# Patient Record
Sex: Female | Born: 1950 | Race: White | Hispanic: No | Marital: Single
Health system: Southern US, Community
[De-identification: ages and names within clinical notes are randomized; demographics above are authoritative.]

---

## 2012-01-24 LAB — CBC
MCH: 31.3 pg (ref 26.0–34.0)
MCHC: 35.2 g/dL (ref 32.0–36.0)
MCV: 89 fL (ref 80–100)
Platelet: 202 10*3/uL (ref 150–440)
RBC: 4.41 10*6/uL (ref 3.80–5.20)
RDW: 14.4 % (ref 11.5–14.5)

## 2012-01-25 ENCOUNTER — Observation Stay: Payer: Self-pay | Admitting: Specialist

## 2012-01-25 LAB — DRUG SCREEN, URINE
Cocaine Metabolite,Ur ~~LOC~~: NEGATIVE (ref ?–300)
MDMA (Ecstasy)Ur Screen: NEGATIVE (ref ?–500)
Tricyclic, Ur Screen: NEGATIVE (ref ?–1000)

## 2012-01-25 LAB — COMPREHENSIVE METABOLIC PANEL
Albumin: 3.8 g/dL (ref 3.4–5.0)
Anion Gap: 6 — ABNORMAL LOW (ref 7–16)
Calcium, Total: 9.3 mg/dL (ref 8.5–10.1)
Chloride: 106 mmol/L (ref 98–107)
Co2: 24 mmol/L (ref 21–32)
Creatinine: 0.7 mg/dL (ref 0.60–1.30)
EGFR (African American): >60
EGFR (Non-African Amer.): >60
Glucose: 101 mg/dL — ABNORMAL HIGH (ref 65–99)
Potassium: 4.1 mmol/L (ref 3.5–5.1)
SGOT(AST): 21 U/L (ref 15–37)

## 2012-01-25 LAB — URINALYSIS, COMPLETE
Leukocyte Esterase: NEGATIVE
Protein: NEGATIVE
RBC,UR: <1 /HPF (ref 0–5)
Specific Gravity: 1.017 (ref 1.003–1.030)
Squamous Epithelial: NONE SEEN

## 2012-01-25 LAB — PROTIME-INR
INR: 1
Prothrombin Time: 13.5 secs (ref 11.5–14.7)

## 2012-01-25 LAB — CK TOTAL AND CKMB (NOT AT ARMC)
CK, Total: 61 U/L (ref 21–215)
CK, Total: 39 U/L (ref 21–215)

## 2012-01-25 LAB — APTT: Activated PTT: 40.7 secs — ABNORMAL HIGH (ref 23.6–35.9)

## 2012-01-26 LAB — BASIC METABOLIC PANEL
BUN: 15 mg/dL (ref 7–18)
Chloride: 107 mmol/L (ref 98–107)
Creatinine: 0.86 mg/dL (ref 0.60–1.30)
Osmolality: 279 (ref 275–301)
Potassium: 4.4 mmol/L (ref 3.5–5.1)

## 2012-01-26 LAB — CBC WITH DIFFERENTIAL/PLATELET
Basophil #: 0 10*3/uL (ref 0.0–0.1)
Basophil %: 0.5 %
Eosinophil #: 0.2 10*3/uL (ref 0.0–0.7)
HGB: 13.8 g/dL (ref 12.0–16.0)
MCH: 30.7 pg (ref 26.0–34.0)
MCHC: 34.2 g/dL (ref 32.0–36.0)
Monocyte #: 0.4 x10 3/mm (ref 0.2–0.9)
Neutrophil %: 67.1 %
Platelet: 173 10*3/uL (ref 150–440)
RDW: 14.7 % — ABNORMAL HIGH (ref 11.5–14.5)

## 2012-01-26 LAB — LIPID PANEL
HDL Cholesterol: 35 mg/dL — ABNORMAL LOW (ref 40–60)
Triglycerides: 264 mg/dL — ABNORMAL HIGH (ref 0–200)
VLDL Cholesterol, Calc: 53 mg/dL — ABNORMAL HIGH (ref 5–40)

## 2012-06-01 ENCOUNTER — Inpatient Hospital Stay: Payer: Self-pay | Admitting: Internal Medicine

## 2012-06-01 LAB — DRUG SCREEN, URINE
Barbiturates, Ur Screen: NEGATIVE (ref ?–200)
Benzodiazepine, Ur Scrn: NEGATIVE (ref ?–200)
Cannabinoid 50 Ng, Ur ~~LOC~~: NEGATIVE (ref ?–50)
Cocaine Metabolite,Ur ~~LOC~~: NEGATIVE (ref ?–300)
Methadone, Ur Screen: NEGATIVE (ref ?–300)
Opiate, Ur Screen: NEGATIVE (ref ?–300)
Phencyclidine (PCP) Ur S: NEGATIVE (ref ?–25)

## 2012-06-01 LAB — URINALYSIS, COMPLETE
Bacteria: NONE SEEN
Ketone: NEGATIVE
Leukocyte Esterase: NEGATIVE
RBC,UR: <1 /HPF (ref 0–5)
WBC UR: 1 /HPF (ref 0–5)

## 2012-06-01 LAB — COMPREHENSIVE METABOLIC PANEL
Anion Gap: 6 — ABNORMAL LOW (ref 7–16)
Bilirubin,Total: 0.2 mg/dL (ref 0.2–1.0)
Calcium, Total: 8.8 mg/dL (ref 8.5–10.1)
Co2: 26 mmol/L (ref 21–32)
EGFR (African American): >60
Osmolality: 275 (ref 275–301)
SGOT(AST): 26 U/L (ref 15–37)
Total Protein: 6.7 g/dL (ref 6.4–8.2)

## 2012-06-01 LAB — CBC
HGB: 13.5 g/dL (ref 12.0–16.0)
MCH: 29.3 pg (ref 26.0–34.0)
MCV: 88 fL (ref 80–100)
RDW: 14 % (ref 11.5–14.5)

## 2014-06-05 NOTE — H&P (Signed)
PATIENT NAME:  Natalie Clements, Natalie Clements MR#:  [id number] DATE OF BIRTH:  [birth year]  DATE OF ADMISSION:  01/25/2012  PRIMARY CARE PHYSICIAN: Unknown  REFERRING PHYSICIAN: Dr. Cyril LoosenKinner   CHIEF COMPLAINT: Altered mental status, dysarthria.   HISTORY OF PRESENT ILLNESS: Patient is a 64 year old Caucasian female with a past medical history of asthma, recent history of shingles and nicotine dependence was sent over to the ER via EMS by patient's baby sitter's parents. Around 10:00 p.m. today patient was baby sitting and suddenly felt off balance. She sat down and then started mumbling. Baby sitter's parents called EMS and sent her to the ER. By the time patient came into the ER she was nonresponsive to her name. By shaking shoulders patient woke up but speech was not clear and she was mumbling as reported by the ER physician. She has normal motor strength. Initially CAT scan of the head was done which was negative for any acute findings. Her urine drug screen was positive for amphetamines. Patient was evaluated by tele neurologist. Patient was lethargic and drowsy but her NIHSS score was just 1 for drowsiness. Tele neurologist has not recommended TPA. Hospitalist team is called to admit the patient under observation status for close monitoring and for further evaluation. During my examination patient is quite lethargic but arousable to verbal commands. Patient was still lethargic and confused and trying to take her gown off. No family members are at bedside. She was able to tell her name but disoriented to place and person. Patient was still mumbling but was answering most of the questions. She has reported that she does not know why she is in the ER. She could not recall any of the events happened during last night. Patient denied any headache, blurry vision, chest pain or shortness of breath. She has reported that recently she had shingles and she was started on gabapentin for shingles. She takes phenteramine for losing  weight. 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 Patient was slowly responding to verbal commands but following commands.   PAST MEDICAL HISTORY:  1. Asthma. 2. Recent history of shingles.  3. Nicotine dependence.  4. Denies any heart attacks or diabetes mellitus.   PAST SURGICAL HISTORY: Denies any.   ALLERGIES: Reports no known drug allergies.   HOME MEDICATIONS:  1. Ventolin 2 puffs inhalation q.6 hours. 2. Phentermine 30 mg once daily. 3. Gabapentin 300 mg 1 capsule 3 times a day.   PSYCHOSOCIAL HISTORY: Lives at home, lives with son. Smokes 1 pack a day. Denies alcohol or illicit drug usage.   FAMILY HISTORY: Father had CAD with stent placement.    REVIEW OF SYSTEMS: CONSTITUTIONAL: Denies any fever, fatigue, weakness, weight loss or weight gain. EYES: Right eye is blind. Denies any blurry vision in the left eye. Denies any cataracts. ENT: Denies tinnitus, ear pain, discharge, snoring, postnasal drip, difficulty in swallowing. RESPIRATORY: Denies cough, wheezing, hemoptysis, painful respirations. CARDIOVASCULAR: Denies any chest pain, orthopnea, syncope, varicose veins. GASTROINTESTINAL: Denies nausea, vomiting, diarrhea, abdominal pain, constipation, rectal bleeding, GERD, change in bowel habits. GENITOURINARY: Denies dysuria, hematuria, renal calculus. GYN: Denies any breast mass, tenderness. Denies any discharge. ENDOCRINE: Denies polyuria, polyphagia, polydipsia, nocturia, thyroid problems, heat or cold intolerance. HEMATOLOGIC/LYMPHATIC: Denies anemia, easy bruising, bleeding. INTEGUMENTARY: Denies acne, rash, lesions. MUSCULOSKELETAL: Denies any pain in the neck, back, shoulders. Denies any arthritis. Denies any gout. NEUROLOGIC: Denies any weakness. Denies any epilepsy. Denies vertigo, ataxia. PSYCH:  Denies anxiety, insomnia, depression, schizophrenia.   PHYSICAL EXAMINATION:  VITAL SIGNS: Temperature 97.8, pulse 66,  respiratory rate 18, blood pressure 114/58.   GENERAL APPEARANCE: Moderately built and moderately nourished. Not in acute distress. Lethargic but arousable and answering most of the questions appropriately.   HEENT: Normocephalic, atraumatic. 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 Right corneal scarring is present. Left eye pupil is dilated to 6 mm, actively reacting to light and accommodation. No conjunctival injection. No scleral icterus. Extraocular movements are intact.  No discharge from the ear. External ear is intact. Tympanic membranes are intact. Nares are patent, no discharge. No postnasal drip. Throat is clear. No laryngeal edema.   NECK: Supple. No JVD. No carotid bruits. No thyromegaly.   LUNGS: Clear to auscultation bilaterally. Moderate air entry. No wheezing. No accessory muscle usage. No chest wall tenderness.   CARDIOVASCULAR: S1, S2 normal. Regular rate and rhythm. No pedal edema. Peripheral pulses are 2+. No murmurs.   GASTROINTESTINAL: Soft. Bowel sounds are positive in all four quadrants. No masses felt. Nontender, nondistended.   NEUROLOGIC: Lethargic but arousable, oriented x1, following verbal commands but sluggishly. Cranial nerves II through XII are grossly intact. Motor intact. No pronator drift. Sensory is normal, intact for touch sensation. Reflexes 2+. Cerebellar gait is unsteady. Negative dysdiadokinesis. Gait is unsteady.    SKIN: No rashes. No lesions. No acne.   MUSCULOSKELETAL: No joint swelling, effusions, erythema.   EXTREMITIES: No edema, no cyanosis, no clubbing.   PSYCH: Flat affect.   LABORATORY, DIAGNOSTIC AND RADIOLOGICAL DATA: CT head no acute findings. Chest x-ray portable no acute abnormalities. 12-lead EKG normal sinus rhythm. No ST-T wave changes. Glucose 101, BUN 16, creatinine 0.70, sodium 136, potassium 4.1, chloride 106, CO2 24, GFR greater than 60, anion gap 6, serum osmolality 273, calcium 9.3,  total protein 7.1, albumin 3.8, bilirubin total 0.4, AST 21, ALT 26, troponin T less than 0.02. Urine tox screen is positive for amphetamines. WBC 9.0, hemoglobin 13.8, hematocrit 39.1, platelet count 202,000, MCV 89, PT 13.5, INR 1.0, activated PTT 40.7. Urinalysis: Yellow in color, clear in appearance. Glucose negative, bilirubin, ketones negative, pH 6.0, nitrite leukocyte esterase negative. Mucous is present.   ASSESSMENT AND PLAN: 64 year old female felt unsteady at home, started mumbling at around 10:00 p.m., was sent over to the ER via EMS. Will be admitted under observation status with the following assessment and plan.  1. Altered mental status with dysarthria, possibly TIA versus CVA versus positive urine drug screen for amphetamines versus sleep disorder. CT head is negative. Will get complete stroke work-up with MRI/MRA of the brain and MRA of the neck in a.m. Will get carotid Doppler and 2-D echocardiogram. Will get neuro checks. Will make her Clements.p.o. and will provide her IV fluids while patient is Clements.p.o. Will give her aspirin and statin. Will check CBC, BMP and fasting lipid panel in a.m. Neurology consult is placed. Physical therapy, speech pathology and case management consult was also placed. Will consider sleep study if MRI/MRA are negative.  2. Urine drug screen is positive for amphetamines. Patient takes phenteramine  for losing weight. Though phenteramine is not an amphetamine it is chemically related and has similar pharmacological side effects and it can show up as a false positive for amphetamines in a drug screen.  3. History of asthma, stable. Provide her bronchodilators as needed basis for wheezing or shortness of breath.  4. Recent history of shingles. Will hold off  on gabapentin as patient is Clements.p.o.  5. Nicotine dependence. Will provide her nicotine patch.  6. Will provide her GI prophylaxis with Protonix and deep vein thrombosis prophylaxis with SCDs.  7. FULL CODE.   TIME  SPENT: Total time spent on admission, reviewing medical records including tele neuro recommendations and coordination of care is 60 minutes.  ____________________________ Ramonita Lab, MD ag:cms D: 01/25/2012 03:53:22 ET T: 01/25/2012 07:25:04 ET JOB#: [id number]  cc: Ramonita Lab, MD, <Dictator> Hemang K. Sherryll Burger, MD Ramonita Lab MD ELECTRONICALLY SIGNED 01/28/2012 1:23

## 2014-06-05 NOTE — Consult Note (Signed)
PATIENT NAME:  Natalie Clements, Natalie Clements MR#:  [id number] DATE OF BIRTH:  1950/09/12  DATE OF CONSULTATION:  01/25/2012  REFERRING PHYSICIAN:  Dr. Amado CoeGouru CONSULTING PHYSICIAN:  Hemang K. Sherryll BurgerShah, MD  REASON FOR CONSULTATION: Altered mental status.   HISTORY OF PRESENT ILLNESS:  Ms. 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   Her parents noticed that the patient was not quite right and brought her to the ER.  Her episode started around 10:00 p.m.  In the ER the patient was very drowsy, less responsive, and was mumbling, but she did not seem to have any focal deficit.  The last thing the patient remembers is around the afternoon of 12/08 when she went into the child's room whom she takes care of with medication.  Then she remembers waking up in the hospital yesterday evening.  She could not tell me exactly what time. When she regained her consciousness or memory she did not remember having the feeling of tongue bite or generalized body weakness or pain. The patient was not noted to have any generalized shaking or jerking. She feels like she is back to herself now.   The patient has a history of a seizure back in 2006 that was thought to be due to Wellbutrin. At that time the patient was married to a physician and her husband felt like that it definitely coming from the Wellbutrin, per the patient.   The patient also has recent history of shingles appearing on her left neck and upper chest region, 2 to 3 vesicles, and was given acyclovir 5 doses per day for 10 days followed by starting gabapentin.  The shingles started in November 2013.   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   The patient has been taking  phentermine for the last 2 to 3 years and is likely the reason for her positive amphetamine on her UDS. The patient denied doing any type of drugs.   PAST MEDICAL HISTORY:  Recent history of shingles, smoking, history of a single seizure, and corneal abrasion.   PAST SURGICAL HISTORY: Negative other than corneal transplants.   ALLERGIES/ MEDICATIONS:  I reviewed her allergies and home medication list.   SOCIAL HISTORY: She lives at home with her son and smokes a pack per day. She denies any alcohol or drug abuse. 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   REVIEW OF SYSTEMS: Positive for feeling confused recently.  Otherwise  review of systems is negative except as per history of present illness. 10-system review of systems was asked.   PHYSICAL EXAMINATION:  VITAL SIGNS: Temperature 98.9, pulse 83, respiratory rate 19, blood pressure 96/64, pulse oximetry 96%.   GENERAL: She is a middle-aged Caucasian female lying in bed, not in acute distress, eating.   EYES: She has significant whitish discoloration of her right cornea and she is practically blind in  that eye.    LUNGS: Clear to auscultation.   HEART: S1, S2 heart sounds. Carotid exam did not reveal any bruit.   SKIN:  She does have some vesicles on her left neck and upper chest region.   NEUROLOGIC: MENTAL STATUS:  She was alert, oriented, followed two-step inverted commands, provided full history. Her language seems to be intact. She did not have any neurological neglect.   Her attention, concentration, and memory seem to be appropriate.   Her mood was good.   On her cranial nerves, I cannot check her right-sided pupil due to corneal opacity. Her left pupil was reactive.   Her face was symmetric. Tongue was midline. Facial sensation was intact. She is blind from her right eye but her visual fields are full on the left.   Her hearing was intact.   On her  motor exam she moved all her extremities symmetrically. Her strength seems to be five out of five with normal tone.   Sensation was intact to light touch. Gait was unremarkable. Her finger-to-nose was okay.   RADIOLOGICAL DATA: CT scan of the head seems to be unremarkable.   ASSESSMENT AND PLAN:  PROBLEM #1:  Single episode of confusion, decreased level of alertness, and mumbling lasting for a few hours.  Now the patient is back to herself.   The patient has a history of one isolated seizure around 2006. I am concerned about this spell being a complex partial seizure without secondary generalization causing tonic-clonic movement but still causing alteration of awareness.   I would like to obtain an EEG. The patient has already been ordered to have MRI of the brain.   I do not plan to start any antiepileptic medication at present.   PROBLEM #2:  Positive urine drug screen likely from her taking phentermine.   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   Feel free to contact me with any further questions. I will follow this patient with you in the hospital.  ____________________________ Hemang K. Sherryll Burger, MD hks:bjt D: 01/25/2012 20:24:43 ET T: 01/26/2012 11:03:06 ET JOB#: [id number]  cc: Hemang K. Sherryll Burger, MD, <Dictator> [facility] MD ELECTRONICALLY SIGNED 02/04/2012 7:06

## 2014-06-05 NOTE — Discharge Summary (Signed)
PATIENT NAME:  Natalie Clements, Natalie Clements MR#:  [id number] DATE OF BIRTH:  1950/03/07  DATE OF ADMISSION:  01/25/2012 DATE OF DISCHARGE:  01/26/2012  For a detailed note, please take a look at the history and physical done on admission by Dr. Amado CoeGouru.   DISCHARGE DIAGNOSES: 1. Altered mental status and slurred speech. Stroke has been ruled out. 2. Neuropathy.  3. Asthma.  4. Tobacco abuse.  5. Hyperlipidemia.   DIET: The patient is being discharged on a low fat diet.   ACTIVITY: As tolerated.  DISCHARGE FOLLOWUP:  Follow-up is going to be with the [facility].   DISCHARGE MEDICATIONS:  1. Gabapentin 300 mg three times daily. 2. Phentermine 30 mg daily.  3. Albuterol inhaler 2 puffs every 6 hours as needed. 4. Aspirin 81 mg daily.  5. Pravachol 20 mg at bedtime.   CONSULTANTS: Cristopher PeruHemang Shah, MD - Neurology.  PERTINENT RESULTS: CT of the head done without contrast on admission showed no acute abnormality.   Chest x-ray done on admission showed minimal left lung base atelectasis.   Ultrasound of the carotids showed minimal atherosclerotic disease. No evidence of hemodynamically significant carotid stenosis.   MRI of the brain done without contrast showed sinusitis with no acute abnormality.   MRA of the brain and also showed normal exam.   The patient also had a two-dimensional echocardiogram the results of which showed an ejection fraction of 65%, moderate mitral regurgitation, mild tricuspid regurgitation, and no apparent source of transient ischemic attack.   HOSPITAL COURSE: This is a 64 year old female who presented to the hospital with slurred speech and altered mental status and suspected to have a possible stroke.  1. Altered mental status/slurred speech. The patient was admitted to the hospital for work-up of suspected stroke and underwent extensive testing including a CT of the head, MRI of the brain, MRA of the brain, carotid duplex, and echocardiogram all of which was  essentially normal. The patient was maintained on aspirin. A neurology consult was also obtained. The patient was seen by Dr. Sherryll BurgerShah who thought that this could also be related to possible seizure but did not want to start her on antiepileptics as this was just an isolated episode. Since her MRI and her imaging studies are negative, she is currently being discharged on aspirin and statin, as mentioned. Her mental status is now back to baseline and she has had no further evidence of slurred speech in the hospital.  2. Hyperlipidemia. The patient has a history of hyperlipidemia. She apparently is on Pravachol at home, although is noncompliant with it. Her cholesterol panel done here in the hospital showed a total cholesterol of 220 and LDL of 132. She was strongly advised to be compliant with her statin.  3. Tobacco abuse. The patient was strongly advised to quit smoking. She was maintained on a nicotine patch while in the hospital. 4. Asthma. There was no evidence of acute asthma exacerbation. The patient was maintained on her albuterol inhaler as needed, which she will resume.  5. Neuropathy. The patient was maintained on Neurontin and she will also resume that.   CODE STATUS: The patient is a FULL CODE.         DISPOSITION: She is being discharged home.   TIME SPENT: 35 minutes.  ____________________________ Rolly PancakeVivek J. Cherlynn KaiserSainani, MD vjs:slb D: 01/26/2012 15:16:33 ET T: 01/26/2012 17:48:06 ET JOB#: [id number]  cc: Rolly PancakeVivek J. Cherlynn KaiserSainani, MD, <Dictator> Houston SirenVIVEK J Pearle Wandler MD ELECTRONICALLY SIGNED 02/11/2012 14:40
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
